# Patient Record
Sex: Female | Born: 1980 | Race: Black or African American | Hispanic: No | Marital: Married | Smoking: Never smoker
Health system: Southern US, Community
[De-identification: ages and names within clinical notes are randomized; demographics above are authoritative.]

## PROBLEM LIST (undated history)

## (undated) DIAGNOSIS — Z789 Other specified health status: Secondary | ICD-10-CM

## (undated) HISTORY — PX: NO PAST SURGERIES: SHX2092

## (undated) HISTORY — DX: Other specified health status: Z78.9

---

## 2018-04-23 ENCOUNTER — Emergency Department (HOSPITAL_BASED_OUTPATIENT_CLINIC_OR_DEPARTMENT_OTHER)
Admission: EM | Admit: 2018-04-23 | Discharge: 2018-04-23 | Disposition: A | Discharge status: Home / Self Care | Payer: BLUE CROSS/BLUE SHIELD | Attending: Emergency Medicine | Admitting: Emergency Medicine

## 2018-04-23 ENCOUNTER — Encounter (HOSPITAL_BASED_OUTPATIENT_CLINIC_OR_DEPARTMENT_OTHER): Payer: Self-pay

## 2018-04-23 ENCOUNTER — Other Ambulatory Visit: Payer: Self-pay

## 2018-04-23 DIAGNOSIS — O2341 Unspecified infection of urinary tract in pregnancy, first trimester: Secondary | ICD-10-CM | POA: Diagnosis not present

## 2018-04-23 DIAGNOSIS — Z3A00 Weeks of gestation of pregnancy not specified: Secondary | ICD-10-CM | POA: Insufficient documentation

## 2018-04-23 DIAGNOSIS — O23591 Infection of other part of genital tract in pregnancy, first trimester: Secondary | ICD-10-CM | POA: Diagnosis not present

## 2018-04-23 DIAGNOSIS — R112 Nausea with vomiting, unspecified: Secondary | ICD-10-CM

## 2018-04-23 DIAGNOSIS — O9989 Other specified diseases and conditions complicating pregnancy, childbirth and the puerperium: Secondary | ICD-10-CM

## 2018-04-23 DIAGNOSIS — R69 Illness, unspecified: Secondary | ICD-10-CM

## 2018-04-23 DIAGNOSIS — O219 Vomiting of pregnancy, unspecified: Secondary | ICD-10-CM | POA: Insufficient documentation

## 2018-04-23 DIAGNOSIS — A5901 Trichomonal vulvovaginitis: Secondary | ICD-10-CM

## 2018-04-23 DIAGNOSIS — R8271 Bacteriuria: Secondary | ICD-10-CM

## 2018-04-23 DIAGNOSIS — J111 Influenza due to unidentified influenza virus with other respiratory manifestations: Secondary | ICD-10-CM

## 2018-04-23 LAB — URINALYSIS, ROUTINE W REFLEX MICROSCOPIC
Glucose, UA: NEGATIVE mg/dL
KETONES UR: 40 mg/dL — AB
NITRITE: NEGATIVE
PH: 6 (ref 5.0–8.0)
Protein, ur: 30 mg/dL — AB
Specific Gravity, Urine: 1.025 (ref 1.005–1.030)

## 2018-04-23 LAB — URINALYSIS, MICROSCOPIC (REFLEX)

## 2018-04-23 LAB — PREGNANCY, URINE: Preg Test, Ur: POSITIVE — AB

## 2018-04-23 LAB — INFLUENZA PANEL BY PCR (TYPE A & B)
INFLBPCR: NEGATIVE
Influenza A By PCR: NEGATIVE

## 2018-04-23 MED ORDER — CEPHALEXIN 500 MG PO CAPS: 500.0000 mg | ORAL_CAPSULE | Freq: Two times a day (BID) | ORAL | 0 refills | Status: AC

## 2018-04-23 MED ORDER — OSELTAMIVIR PHOSPHATE 75 MG PO CAPS: 75.0000 mg | ORAL_CAPSULE | Freq: Two times a day (BID) | ORAL | 0 refills | Status: AC

## 2018-04-23 MED ORDER — ONDANSETRON HCL 4 MG/2ML IJ SOLN
4.0000 mg | Freq: Once | INTRAMUSCULAR | Status: AC
  Administered 2018-04-23: 4 mg via INTRAVENOUS
  Filled 2018-04-23: qty 2

## 2018-04-23 MED ORDER — ONDANSETRON 4 MG PO TBDP: ORAL_TABLET | ORAL | 0 refills | Status: AC

## 2018-04-23 MED ORDER — METRONIDAZOLE 500 MG PO TABS
2000.0000 mg | ORAL_TABLET | Freq: Once | ORAL | Status: AC
  Administered 2018-04-23: 2000 mg via ORAL
  Filled 2018-04-23: qty 4

## 2018-04-23 MED ORDER — SODIUM CHLORIDE 0.9 % IV BOLUS
1000.0000 mL | Freq: Once | INTRAVENOUS | Status: AC
  Administered 2018-04-23: 1000 mL via INTRAVENOUS

## 2018-04-23 NOTE — ED Provider Notes (Signed)
[facility] EMERGENCY DEPARTMENT Provider Note   CSN: [id number] Arrival date & time: 04/23/18  1356     History   Chief Complaint Chief Complaint  Patient presents with  . Influenza    HPI Yolanda Mora is a 38 y.o. female.  Yolanda Mora is a 38 y.o. female who is otherwise healthy, presents to the emergency department for evaluation of 2 days of chills, body aches, cough, nasal congestion, nausea and vomiting.  Patient also reports that she is pregnant, had positive home pregnancy test a few weeks ago and reports that her LMP was on 02/15/2018, she has not seen an OB/GYN yet or had any prenatal care.  Patient reports that flulike symptoms started 2 days ago and have been constant and worsening since onset.  She reports cough is dry and nonproductive.  She has not taken her temperature at home but has had intermittent chills with low-grade fever on arrival.  She reports no chest pain or shortness of breath.  She has had nausea and vomiting over the past 2 days but no other nausea and vomiting during her pregnancy so far, ports she has had a hard time keeping down liquids or solids and has not taken anything for this nausea prior to arrival.  She denies any associated abdominal pain or pelvic pain and has not had any vaginal bleeding or discharge.  She denies any dysuria or urinary frequency.  She took some Tylenol for her symptoms yesterday but has not taken anything else prior to arrival, no other aggravating or alleviating factors.  No flu vaccine this year.  Patient has OB/GYN appointment on 2/4.     History reviewed. No pertinent past medical history.  There are no active problems to display for this patient.   History reviewed. No pertinent surgical history.   OB History    Gravida  1   Para      Term      Preterm      AB      Living        SAB      TAB      Ectopic      Multiple      Live Births               Home Medications    Prior to  Admission medications   Not on File    Family History No family history on file.  Social History Social History   Tobacco Use  . Smoking status: Never Smoker  . Smokeless tobacco: Never Used  Substance Use Topics  . Alcohol use: Never    Frequency: Never  . Drug use: Never     Allergies   Patient has no known allergies.   Review of Systems Review of Systems  Constitutional: Positive for chills and fever.  HENT: Positive for congestion, postnasal drip, rhinorrhea and sore throat. Negative for ear pain.   Respiratory: Positive for cough. Negative for chest tightness, shortness of breath and wheezing.   Cardiovascular: Negative for chest pain and leg swelling.  Gastrointestinal: Positive for nausea and vomiting. Negative for abdominal pain, blood in stool, constipation and diarrhea.  Genitourinary: Negative for dysuria, flank pain, frequency, hematuria, menstrual problem, pelvic pain, vaginal bleeding and vaginal discharge.  Musculoskeletal: Negative for arthralgias and myalgias.  Skin: Negative for color change and rash.  Neurological: Negative for dizziness, syncope and light-headedness.  All other systems reviewed and are negative.    Physical Exam Updated Vital  Signs BP (!) 149/80 (BP Location: Left Arm)   Pulse 84   Temp 99.4 F (37.4 C) (Oral)   Resp 18   Ht 5\' 9"  (1.753 m)   Wt 93 kg   LMP 02/15/2018 (Exact Date)   SpO2 99%   BMI 30.28 kg/m   Physical Exam Vitals signs and nursing note reviewed.  Constitutional:      General: She is not in acute distress.    Appearance: Normal appearance. She is well-developed and normal weight. She is not ill-appearing or diaphoretic.  HENT:     Head: Normocephalic and atraumatic.     Right Ear: Tympanic membrane and ear canal normal.     Left Ear: Tympanic membrane and ear canal normal.     Nose: Congestion and rhinorrhea present.     Comments: Bilateral nares patent with moderate mucosal edema and clear rhinorrhea  present.     Mouth/Throat:     Mouth: Mucous membranes are moist.     Pharynx: Oropharynx is clear. Posterior oropharyngeal erythema present.     Comments: Posterior oropharynx clear and mucous membranes moist, there is mild erythema but no edema or tonsillar exudates, uvula midline, normal phonation, no trismus, tolerating secretions without difficulty. Eyes:     General:        Right eye: No discharge.        Left eye: No discharge.  Neck:     Musculoskeletal: Neck supple. No neck rigidity.     Comments: No rigidity Cardiovascular:     Rate and Rhythm: Normal rate and regular rhythm.     Pulses: Normal pulses.     Heart sounds: Normal heart sounds. No murmur. No friction rub. No gallop.   Pulmonary:     Effort: Pulmonary effort is normal. No respiratory distress.     Breath sounds: Normal breath sounds.     Comments: Respirations equal and unlabored, patient able to speak in full sentences, lungs clear to auscultation bilaterally Abdominal:     General: Abdomen is flat. Bowel sounds are normal. There is no distension.     Palpations: Abdomen is soft. There is no mass.     Tenderness: There is no abdominal tenderness. There is no guarding.     Comments: Abdomen soft, nondistended, nontender to palpation in all quadrants without guarding or peritoneal signs  Genitourinary:    Comments: Deferred given patient is having no vaginal bleeding or discharge or lower abdominal pain Musculoskeletal:        General: No deformity.     Right lower leg: No edema.     Left lower leg: No edema.  Lymphadenopathy:     Cervical: No cervical adenopathy.  Skin:    General: Skin is warm and dry.     Capillary Refill: Capillary refill takes less than 2 seconds.  Neurological:     Mental Status: She is alert and oriented to person, place, and time.  Psychiatric:        Mood and Affect: Mood normal.        Behavior: Behavior normal.      ED Treatments / Results  Labs (all labs ordered are  listed, but only abnormal results are displayed) Labs Reviewed  PREGNANCY, URINE - Abnormal; Notable for the following components:      Result Value   Preg Test, Ur POSITIVE (*)    All other components within normal limits  URINALYSIS, ROUTINE W REFLEX MICROSCOPIC - Abnormal; Notable for the following components:   Hgb  urine dipstick TRACE (*)    Bilirubin Urine SMALL (*)    Ketones, ur 40 (*)    Protein, ur 30 (*)    Leukocytes, UA MODERATE (*)    All other components within normal limits  URINALYSIS, MICROSCOPIC (REFLEX) - Abnormal; Notable for the following components:   Bacteria, UA RARE (*)    Trichomonas, UA PRESENT (*)    All other components within normal limits  INFLUENZA PANEL BY PCR (TYPE A & B)    EKG None  Radiology No results found.  Procedures Procedures (including critical care time)  Medications Ordered in ED Medications  sodium chloride 0.9 % bolus 1,000 mL (0 mLs Intravenous Stopped 04/23/18 1717)  ondansetron (ZOFRAN) injection 4 mg (4 mg Intravenous Given 04/23/18 1553)  metroNIDAZOLE (FLAGYL) tablet 2,000 mg (2,000 mg Oral Given 04/23/18 1700)     Initial Impression / Assessment and Plan / ED Course  I have reviewed the triage vital signs and the nursing notes.  Pertinent labs & imaging results that were available during my care of the patient were reviewed by me and considered in my medical decision making (see chart for details).  Patient presents with 2 days of flulike illness with chills, body aches, cough, nasal congestion and sore throat.  Patient also reports she is pregnant and had positive home pregnancy test 2 weeks ago, LMP of 02/16/2012.  She is not having any abdominal pain, vaginal bleeding or vaginal discharge and has OB/GYN appointment in 3 days.  She is well-appearing with low-grade fever but normal vitals.  Lungs are clear, abdominal exam is benign.  Will check urine pregnancy to confirm pregnancy and urinalysis and will send flu swab.   Patient has had nausea and vomiting over the past 2 days but appears well-hydrated, will give IV fluids and Zofran.  Case discussed with Dr. Jodi Mourning who saw and evaluated patient as well and agrees with plan, does not feel that further work-up in the setting of pregnancy is indicated.  After Zofran and fluids patient reports she is feeling much better and has been able to tolerate p.o.  Urine pregnancy is positive and urinalysis does show moderate leukocytes with rare bacteria and WBCs present, while she is not having focal urinary symptoms this is concerning for asymptomatic bacteriuria in pregnancy, patient also found to have trichomonas on urinalysis.  Will treat with Flagyl and start patient on Keflex for bacteriuria.  Urine culture sent.  Will start patient on Tamiflu for flulike symptoms and discussed symptomatic treatment, short course of Zofran provided for persistent vomiting but I have also discussed using small frequent meals, ginger and doxylamine for nausea before this and using Zofran for breakthrough symptoms.  Patient with OB/GYN follow-up in a few days.  Return precautions discussed.  Patient expresses understanding and agreement with plan.  Patient discussed with Dr. Jodi Mourning, who saw patient as well and agrees with plan.  Final Clinical Impressions(s) / ED Diagnoses   Final diagnoses:  Influenza-like illness  Non-intractable vomiting with nausea, unspecified vomiting type  Trichomonal vaginitis during pregnancy in first trimester  Bacteriuria during pregnancy in first trimester    ED Discharge Orders         Ordered    cephALEXin (KEFLEX) 500 MG capsule  2 times daily     04/23/18 1703    oseltamivir (TAMIFLU) 75 MG capsule  Every 12 hours     04/23/18 1703    ondansetron (ZOFRAN ODT) 4 MG disintegrating tablet     04/23/18  [address]           Dartha Lodge, [place] 04/25/18 1424    Blane Ohara, MD 04/25/18 [phone]

## 2018-04-23 NOTE — ED Notes (Signed)
Pt verbalized understanding of dc instructions.

## 2018-04-23 NOTE — Discharge Instructions (Addendum)
You are having flulike symptoms, your flu test is pending but given your current pregnancy I would like for you to begin taking Tamiflu, you can also use Tylenol to help support your symptoms.  Zofran as needed for nausea and you can also eat small frequent meals and use ginger candy or over-the-counter doxylamine (Unisom).  Your urine showed trichomonas today which you were treated for, you need to let any partners know so they can be tested and treated as well and make sure you are using protection.  Your urine also showed some bacteria and since you are having some urinary discomfort please take Keflex as directed for the next 5 days, you have a culture sent will be called if a different antibiotic would be more appropriate.  Follow-up with your OB/GYN on Tuesday as directed, please begin taking prenatal vitamins.  Return to the emergency department or go to the MAU at [facility] if you begin having severe abdominal pain, vaginal bleeding, persistent vomiting and are unable to keep down fluids or any other new or concerning symptoms.

## 2018-04-23 NOTE — ED Triage Notes (Signed)
Pt c/o N/V, decreased appetite, URI symptoms, body aches x2 days.

## 2018-04-23 NOTE — ED Triage Notes (Signed)
Pt states she is pregnant and has yet to receive an EDD.

## 2018-04-25 LAB — URINE CULTURE

## 2018-04-26 ENCOUNTER — Telehealth: Payer: Self-pay | Admitting: *Deleted

## 2018-04-26 ENCOUNTER — Ambulatory Visit (INDEPENDENT_AMBULATORY_CARE_PROVIDER_SITE_OTHER): Payer: BLUE CROSS/BLUE SHIELD | Admitting: Advanced Practice Midwife

## 2018-04-26 ENCOUNTER — Encounter: Payer: Self-pay | Admitting: Advanced Practice Midwife

## 2018-04-26 DIAGNOSIS — O26891 Other specified pregnancy related conditions, first trimester: Secondary | ICD-10-CM

## 2018-04-26 DIAGNOSIS — Z6791 Unspecified blood type, Rh negative: Secondary | ICD-10-CM

## 2018-04-26 DIAGNOSIS — Z113 Encounter for screening for infections with a predominantly sexual mode of transmission: Secondary | ICD-10-CM

## 2018-04-26 DIAGNOSIS — Z3480 Encounter for supervision of other normal pregnancy, unspecified trimester: Secondary | ICD-10-CM | POA: Insufficient documentation

## 2018-04-26 DIAGNOSIS — O09521 Supervision of elderly multigravida, first trimester: Secondary | ICD-10-CM

## 2018-04-26 DIAGNOSIS — O26899 Other specified pregnancy related conditions, unspecified trimester: Secondary | ICD-10-CM | POA: Insufficient documentation

## 2018-04-26 DIAGNOSIS — Z3481 Encounter for supervision of other normal pregnancy, first trimester: Secondary | ICD-10-CM

## 2018-04-26 DIAGNOSIS — O09529 Supervision of elderly multigravida, unspecified trimester: Secondary | ICD-10-CM

## 2018-04-26 LAB — POCT URINALYSIS DIPSTICK OB
Blood, UA: NEGATIVE
Glucose, UA: NEGATIVE
Leukocytes, UA: NEGATIVE
Spec Grav, UA: 1.02 (ref 1.010–1.025)
pH, UA: 6 (ref 5.0–8.0)

## 2018-04-26 MED ORDER — PROMETHAZINE HCL 25 MG PO TABS: 25.0000 mg | ORAL_TABLET | Freq: Four times a day (QID) | ORAL | 2 refills | Status: AC | PRN

## 2018-04-26 MED ORDER — PRENATAL VITAMINS 0.8 MG PO TABS: 1.0000 | ORAL_TABLET | Freq: Every day | ORAL | 12 refills | Status: AC

## 2018-04-26 NOTE — Patient Instructions (Signed)
First Trimester of Pregnancy  The first trimester of pregnancy is from week 1 until the end of week 13 (months 1 through 3). During this time, your baby will begin to develop inside you. At 6-8 weeks, the eyes and face are formed, and the heartbeat can be seen on ultrasound. At the end of 12 weeks, all the baby's organs are formed. Prenatal care is all the medical care you receive before the birth of your baby. Make sure you get good prenatal care and follow all of your doctor's instructions. Follow these instructions at home: Medicines  Take over-the-counter and prescription medicines only as told by your doctor. Some medicines are safe and some medicines are not safe during pregnancy.  Take a prenatal vitamin that contains at least 600 micrograms (mcg) of folic acid.  If you have trouble pooping (constipation), take medicine that will make your stool soft (stool softener) if your doctor approves. Eating and drinking   Eat regular, healthy meals.  Your doctor will tell you the amount of weight gain that is right for you.  Avoid raw meat and uncooked cheese.  If you feel sick to your stomach (nauseous) or throw up (vomit): ? Eat 4 or 5 small meals a day instead of 3 large meals. ? Try eating a few soda crackers. ? Drink liquids between meals instead of during meals.  To prevent constipation: ? Eat foods that are high in fiber, like fresh fruits and vegetables, whole grains, and beans. ? Drink enough fluids to keep your pee (urine) clear or pale yellow. Activity  Exercise only as told by your doctor. Stop exercising if you have cramps or pain in your lower belly (abdomen) or low back.  Do not exercise if it is too hot, too humid, or if you are in a place of great height (high altitude).  Try to avoid standing for long periods of time. Move your legs often if you must stand in one place for a long time.  Avoid heavy lifting.  Wear low-heeled shoes. Sit and stand up straight.   You can have sex unless your doctor tells you not to. Relieving pain and discomfort  Wear a good support bra if your breasts are sore.  Take warm water baths (sitz baths) to soothe pain or discomfort caused by hemorrhoids. Use hemorrhoid cream if your doctor says it is okay.  Rest with your legs raised if you have leg cramps or low back pain.  If you have puffy, bulging veins (varicose veins) in your legs: ? Wear support hose or compression stockings as told by your doctor. ? Raise (elevate) your feet for 15 minutes, 3-4 times a day. ? Limit salt in your food. Prenatal care  Schedule your prenatal visits by the twelfth week of pregnancy.  Write down your questions. Take them to your prenatal visits.  Keep all your prenatal visits as told by your doctor. This is important. Safety  Wear your seat belt at all times when driving.  Make a list of emergency phone numbers. The list should include numbers for family, friends, the hospital, and police and fire departments. General instructions  Ask your doctor for a referral to a local prenatal class. Begin classes no later than at the start of month 6 of your pregnancy.  Ask for help if you need counseling or if you need help with nutrition. Your doctor can give you advice or tell you where to go for help.  Do not use hot tubs, steam   rooms, or saunas.  Do not douche or use tampons or scented sanitary pads.  Do not cross your legs for long periods of time.  Avoid all herbs and alcohol. Avoid drugs that are not approved by your doctor.  Do not use any tobacco products, including cigarettes, chewing tobacco, and electronic cigarettes. If you need help quitting, ask your doctor. You may get counseling or other support to help you quit.  Avoid cat litter boxes and soil used by cats. These carry germs that can cause birth defects in the baby and can cause a loss of your baby (miscarriage) or stillbirth.  Visit your dentist. At home,  brush your teeth with a soft toothbrush. Be gentle when you floss. Contact a doctor if:  You are dizzy.  You have mild cramps or pressure in your lower belly.  You have a nagging pain in your belly area.  You continue to feel sick to your stomach, you throw up, or you have watery poop (diarrhea).  You have a bad smelling fluid coming from your vagina.  You have pain when you pee (urinate).  You have increased puffiness (swelling) in your face, hands, legs, or ankles. Get help right away if:  You have a fever.  You are leaking fluid from your vagina.  You have spotting or bleeding from your vagina.  You have very bad belly cramping or pain.  You gain or lose weight rapidly.  You throw up blood. It may look like coffee grounds.  You are around people who have German measles, fifth disease, or chickenpox.  You have a very bad headache.  You have shortness of breath.  You have any kind of trauma, such as from a fall or a car accident. Summary  The first trimester of pregnancy is from week 1 until the end of week 13 (months 1 through 3).  To take care of yourself and your unborn baby, you will need to eat healthy meals, take medicines only if your doctor tells you to do so, and do activities that are safe for you and your baby.  Keep all follow-up visits as told by your doctor. This is important as your doctor will have to ensure that your baby is healthy and growing well. This information is not intended to replace advice given to you by your health care provider. Make sure you discuss any questions you have with your health care provider. Document Released: 08/26/2007 Document Revised: 03/17/2016 Document Reviewed: 03/17/2016 Elsevier Interactive Patient Education  2019 [employer] Inc[employer]  

## 2018-04-26 NOTE — Telephone Encounter (Signed)
Post ED Visit - Positive Culture Follow-up  Culture report reviewed by antimicrobial stewardship pharmacist:  []  Enzo Bi, Pharm.D. []  Celedonio Miyamoto, Pharm.D., BCPS AQ-ID []  Garvin Fila, Pharm.D., BCPS []  Georgina Pillion, Pharm.D., BCPS []  [place], [address].D., BCPS, AAHIVP []  Estella Husk, Pharm.D., BCPS, AAHIVP []  Lysle Pearl, PharmD, BCPS []  Phillips Climes, PharmD, BCPS []  Agapito Games, PharmD, BCPS []  Verlan Friends, PharmD Babs Bertin, PharmD  Positive urine culture Treated with cephalexin, organism sensitive to the same and no further patient follow-up is required at this time.  Virl [facility] 04/26/2018, 10:39 AM

## 2018-04-26 NOTE — Progress Notes (Signed)
  Subjective:    Yolanda Mora is a [id number] [redacted]w[redacted]d being seen today for her first obstetrical visit.  Her obstetrical history is significant for AMA. Patient does intend to breast feed. Pregnancy history fully reviewed.  Patient reports nausea.  Vitals:   04/26/18 0910  BP: (!) 122/58  Pulse: 72  Weight: 93.9 kg    HISTORY: OB History  Gravida Para Term Preterm AB Living  6 5 5     5   SAB TAB Ectopic Multiple Live Births          5    # Outcome Date GA Lbr Len/2nd Weight Sex Delivery Anes PTL Lv  6 Current           5 Term 2018 [redacted]w[redacted]d   M Vag-Spont None  LIV  4 Term 2014 [redacted]w[redacted]d   F Vag-Spont None  LIV  3 Term 2008 [redacted]w[redacted]d   F Vag-Spont None N LIV  2 Term 2003 [redacted]w[redacted]d   F Vag-Spont None N LIV  1 Term 2002 [redacted]w[redacted]d   M Vag-Spont None N LIV   No past medical history on file. No past surgical history on file. No family history on file.   Exam    Uterus:     Pelvic Exam:    Perineum: Normal Perineum   Vulva: Bartholin's, Urethra, Skene's normal   Vagina:  normal discharge   pH:    Cervix: no cervical motion tenderness   Adnexa: no mass, fullness, tenderness   Bony Pelvis: gynecoid  System: Breast:  normal appearance, no masses or tenderness   Skin: normal coloration and turgor, no rashes    Neurologic: oriented, grossly non-focal   Extremities: normal strength, tone, and muscle mass   HEENT neck supple with midline trachea   Mouth/Teeth mucous membranes moist, pharynx normal without lesions   Neck no masses   Cardiovascular: regular rate and rhythm   Respiratory:  appears well, vitals normal, no respiratory distress, acyanotic, normal RR, ear and throat exam is normal, neck free of mass or lymphadenopathy, chest clear, no wheezing, crepitations, rhonchi, normal symmetric air entry   Abdomen: soft, non-tender; bowel sounds normal; no masses,  no organomegaly   Urinary: urethral meatus normal      Assessment:    Pregnancy: [id number] Patient Active Problem List   Diagnosis  Date Noted  . Supervision of other normal pregnancy, antepartum 04/26/2018  . AMA (advanced maternal age) multigravida 35+ 04/26/2018  . Rh negative status during pregnancy 04/26/2018        Plan:     Initial labs drawn. Prenatal vitamins. Problem list reviewed and updated. Genetic Screening discussed First Screen: Wants to do Panorama, then will do AFP only.  Ultrasound discussed; fetal survey: requested.  Follow up in 4 weeks. 50% of 30 min visit spent on counseling and coordination of care.   Welcomed to practice Last baby born at [facility], others in [place] Routines reviewed, including learners and move to new [facility]   Wynelle Bourgeois 04/26/2018

## 2018-04-27 LAB — GC/CHLAMYDIA PROBE AMP (~~LOC~~) NOT AT ARMC
Chlamydia: NEGATIVE
Neisseria Gonorrhea: NEGATIVE

## 2018-04-28 LAB — URINE CULTURE, OB REFLEX: Organism ID, Bacteria: NO GROWTH

## 2018-04-28 LAB — CULTURE, OB URINE

## 2018-05-09 LAB — OBSTETRIC PANEL, INCLUDING HIV
ANTIBODY SCREEN: NEGATIVE
Basophils Absolute: 0 10*3/uL (ref 0.0–0.2)
Basos: 1 %
EOS (ABSOLUTE): 0 10*3/uL (ref 0.0–0.4)
EOS: 1 %
HEMATOCRIT: 34.8 % (ref 34.0–46.6)
HEMOGLOBIN: 11.8 g/dL (ref 11.1–15.9)
HIV Screen 4th Generation wRfx: NONREACTIVE
Hepatitis B Surface Ag: NEGATIVE
Immature Grans (Abs): 0 10*3/uL (ref 0.0–0.1)
Immature Granulocytes: 0 %
Lymphocytes Absolute: 1.2 10*3/uL (ref 0.7–3.1)
Lymphs: 28 %
MCH: 29.1 pg (ref 26.6–33.0)
MCHC: 33.9 g/dL (ref 31.5–35.7)
MCV: 86 fL (ref 79–97)
Monocytes Absolute: 0.3 10*3/uL (ref 0.1–0.9)
Monocytes: 7 %
Neutrophils Absolute: 2.7 10*3/uL (ref 1.4–7.0)
Neutrophils: 63 %
Platelets: 307 10*3/uL (ref 150–450)
RBC: 4.05 x10E6/uL (ref 3.77–5.28)
RDW: 14.3 % (ref 11.7–15.4)
RH TYPE: POSITIVE
RPR: NONREACTIVE
Rubella Antibodies, IGG: 1.55 index (ref >0.99)
WBC: 4.2 10*3/uL (ref 3.4–10.8)

## 2018-05-09 LAB — HEMOGLOBINOPATHY EVALUATION
Ferritin: 42 ng/mL (ref 15–150)
Hgb A2 Quant: 2 % (ref 1.8–3.2)
Hgb A: 98 % (ref 96.4–98.8)
Hgb C: 0 %
Hgb F Quant: 0 % (ref 0.0–2.0)
Hgb S: 0 %
Hgb Solubility: NEGATIVE
Hgb Variant: 0 %

## 2018-05-09 LAB — SMN1 COPY NUMBER ANALYSIS (SMA CARRIER SCREENING)

## 2018-05-24 ENCOUNTER — Ambulatory Visit (INDEPENDENT_AMBULATORY_CARE_PROVIDER_SITE_OTHER): Payer: BLUE CROSS/BLUE SHIELD | Admitting: Advanced Practice Midwife

## 2018-05-24 ENCOUNTER — Encounter: Payer: Self-pay | Admitting: Advanced Practice Midwife

## 2018-05-24 DIAGNOSIS — Z3480 Encounter for supervision of other normal pregnancy, unspecified trimester: Secondary | ICD-10-CM

## 2018-05-24 DIAGNOSIS — Z3482 Encounter for supervision of other normal pregnancy, second trimester: Secondary | ICD-10-CM

## 2018-05-24 DIAGNOSIS — Z3A14 14 weeks gestation of pregnancy: Secondary | ICD-10-CM

## 2018-05-24 NOTE — Progress Notes (Signed)
   PRENATAL VISIT NOTE  Subjective:  Yolanda Mora is a 38 y.o. G6P5005 at [redacted]w[redacted]d being seen today for ongoing prenatal care.  She is currently monitored for the following issues for this low-risk pregnancy and has Supervision of other normal pregnancy, antepartum; AMA (advanced maternal age) multigravida 35+; and Rh negative status during pregnancy on their problem list.  Patient reports no complaints.  Contractions: Not present. Vag. Bleeding: None.   . Denies leaking of fluid.   The following portions of the patient's history were reviewed and updated as appropriate: allergies, current medications, past family history, past medical history, past social history, past surgical history and problem list. Problem list updated.  Objective:   Vitals:   05/24/18 0919  BP: 130/63  Pulse: 72  Weight: 95.3 kg    Fetal Status: Fetal Heart Rate (bpm): 158 Fundal Height: 14 cm       General:  Alert, oriented and cooperative. Patient is in no acute distress.  Skin: Skin is warm and dry. No rash noted.   Cardiovascular: Normal heart rate noted  Respiratory: Normal respiratory effort, no problems with respiration noted  Abdomen: Soft, gravid, appropriate for gestational age.  Pain/Pressure: Absent     Pelvic: Cervical exam deferred        Extremities: Normal range of motion.  Edema: None  Mental Status: Normal mood and affect. Normal behavior. Normal judgment and thought content.   Assessment and Plan:  Pregnancy: G6P5005 at [redacted]w[redacted]d  1. Supervision of other normal pregnancy, antepartum     Reviewed normal prenatal labs      Has [place] scheduled for April      Discussed possible recommendation for ASA, but she does not have any other risk factors.   Preterm labor symptoms and general obstetric precautions including but not limited to vaginal bleeding, contractions, leaking of fluid and fetal movement were reviewed in detail with the patient. Please refer to After Visit Summary for other counseling  recommendations.    Future Appointments  Date Time Provider Department Center  06/24/2018 10:30 AM Levie Heritage, DO [facility] None  06/30/2018 10:00 AM [facility] [place] 3 [facility]    Wynelle Bourgeois, CNM

## 2018-05-24 NOTE — Patient Instructions (Signed)

## 2018-06-24 ENCOUNTER — Other Ambulatory Visit: Payer: Self-pay

## 2018-06-24 ENCOUNTER — Ambulatory Visit (INDEPENDENT_AMBULATORY_CARE_PROVIDER_SITE_OTHER): Payer: BLUE CROSS/BLUE SHIELD | Admitting: Family Medicine

## 2018-06-24 VITALS — BP 132/76 | HR 88 | Wt 212.0 lb

## 2018-06-24 DIAGNOSIS — Z6791 Unspecified blood type, Rh negative: Secondary | ICD-10-CM

## 2018-06-24 DIAGNOSIS — O26892 Other specified pregnancy related conditions, second trimester: Secondary | ICD-10-CM

## 2018-06-24 DIAGNOSIS — Z3480 Encounter for supervision of other normal pregnancy, unspecified trimester: Secondary | ICD-10-CM

## 2018-06-24 DIAGNOSIS — O09522 Supervision of elderly multigravida, second trimester: Secondary | ICD-10-CM

## 2018-06-24 DIAGNOSIS — O360920 Maternal care for other rhesus isoimmunization, second trimester, not applicable or unspecified: Secondary | ICD-10-CM

## 2018-06-24 DIAGNOSIS — Z3A18 18 weeks gestation of pregnancy: Secondary | ICD-10-CM

## 2018-06-24 MED ORDER — ASPIRIN EC 81 MG PO TBEC: 81.0000 mg | DELAYED_RELEASE_TABLET | Freq: Every day | ORAL | 2 refills | Status: AC

## 2018-06-24 NOTE — Progress Notes (Signed)
   PRENATAL VISIT NOTE  Subjective:  Yolanda Mora is a 38 y.o. G6P5005 at [redacted]w[redacted]d being seen today for ongoing prenatal care.  She is currently monitored for the following issues for this high-risk pregnancy and has Supervision of other normal pregnancy, antepartum; AMA (advanced maternal age) multigravida 35+; and Rh negative status during pregnancy on their problem list.  Patient reports no complaints.  Contractions: Not present. Vag. Bleeding: None.   . Denies leaking of fluid.   The following portions of the patient's history were reviewed and updated as appropriate: allergies, current medications, past family history, past medical history, past social history, past surgical history and problem list.   Objective:   Vitals:   06/24/18 1028  BP: 132/76  Pulse: 88  Weight: 212 lb (96.2 kg)    Fetal Status: Fetal Heart Rate (bpm): 145 Fundal Height: 18 cm       General:  Alert, oriented and cooperative. Patient is in no acute distress.  Skin: Skin is warm and dry. No rash noted.   Cardiovascular: Normal heart rate noted  Respiratory: Normal respiratory effort, no problems with respiration noted  Abdomen: Soft, gravid, appropriate for gestational age.  Pain/Pressure: Absent     Pelvic: Cervical exam deferred        Extremities: Normal range of motion.  Edema: None  Mental Status: Normal mood and affect. Normal behavior. Normal judgment and thought content.   Assessment and Plan:  Pregnancy: G6P5005 at [redacted]w[redacted]d 1. Supervision of other normal pregnancy, antepartum FHT and FH normal. Weight gain normal.  - Babyscripts Schedule Optimization  2. Multigravida of advanced maternal age in second trimester Start ASA 81mg   3. Rh negative status during pregnancy in second trimester Rhogam at next visit  Preterm labor symptoms and general obstetric precautions including but not limited to vaginal bleeding, contractions, leaking of fluid and fetal movement were reviewed in detail with the  patient. Please refer to After Visit Summary for other counseling recommendations.   Return in about 8 weeks (around 08/19/2018) for OB f/u.  Future Appointments  Date Time Provider Department Center  07/11/2018 12:45 PM [facility] NURSE [facility]  07/11/2018 12:45 PM [facility] [place] [facility]  08/19/2018  8:45 AM Levie Heritage, DO [facility] None    Levie Heritage, DO

## 2018-06-30 ENCOUNTER — Other Ambulatory Visit (HOSPITAL_COMMUNITY): Payer: BLUE CROSS/BLUE SHIELD

## 2018-06-30 ENCOUNTER — Ambulatory Visit (HOSPITAL_COMMUNITY): Payer: BLUE CROSS/BLUE SHIELD

## 2018-07-11 ENCOUNTER — Other Ambulatory Visit: Payer: Self-pay

## 2018-07-11 ENCOUNTER — Ambulatory Visit (HOSPITAL_COMMUNITY): Payer: BLUE CROSS/BLUE SHIELD

## 2018-07-11 ENCOUNTER — Ambulatory Visit (HOSPITAL_COMMUNITY): Payer: BLUE CROSS/BLUE SHIELD | Admitting: *Deleted

## 2018-07-11 ENCOUNTER — Other Ambulatory Visit (HOSPITAL_COMMUNITY): Payer: Self-pay | Admitting: *Deleted

## 2018-07-11 ENCOUNTER — Ambulatory Visit (HOSPITAL_COMMUNITY)
Admission: RE | Admit: 2018-07-11 | Discharge: 2018-07-11 | Disposition: A | Discharge status: Home / Self Care | Payer: BLUE CROSS/BLUE SHIELD | Source: Ambulatory Visit | Attending: Obstetrics and Gynecology | Admitting: Obstetrics and Gynecology

## 2018-07-11 ENCOUNTER — Encounter (HOSPITAL_COMMUNITY): Payer: Self-pay

## 2018-07-11 VITALS — Temp 98.7°F

## 2018-07-11 DIAGNOSIS — O360122 Maternal care for anti-D [Rh] antibodies, second trimester, fetus 2: Secondary | ICD-10-CM | POA: Diagnosis not present

## 2018-07-11 DIAGNOSIS — O09523 Supervision of elderly multigravida, third trimester: Secondary | ICD-10-CM

## 2018-07-11 DIAGNOSIS — Z363 Encounter for antenatal screening for malformations: Secondary | ICD-10-CM

## 2018-07-11 DIAGNOSIS — Z3480 Encounter for supervision of other normal pregnancy, unspecified trimester: Secondary | ICD-10-CM | POA: Diagnosis present

## 2018-07-11 DIAGNOSIS — O283 Abnormal ultrasonic finding on antenatal screening of mother: Secondary | ICD-10-CM

## 2018-07-11 DIAGNOSIS — Z3A20 20 weeks gestation of pregnancy: Secondary | ICD-10-CM | POA: Diagnosis not present

## 2018-07-11 DIAGNOSIS — O09529 Supervision of elderly multigravida, unspecified trimester: Secondary | ICD-10-CM | POA: Diagnosis present

## 2018-07-11 DIAGNOSIS — O09522 Supervision of elderly multigravida, second trimester: Secondary | ICD-10-CM | POA: Diagnosis not present

## 2018-07-12 ENCOUNTER — Encounter: Payer: Self-pay | Admitting: Advanced Practice Midwife

## 2018-07-12 DIAGNOSIS — O283 Abnormal ultrasonic finding on antenatal screening of mother: Secondary | ICD-10-CM | POA: Insufficient documentation

## 2018-07-15 LAB — MATERNIT 21 PLUS CORE, BLOOD
Fetal Fraction: 5
Result (T21): NEGATIVE
Trisomy 13 (Patau syndrome): NEGATIVE
Trisomy 18 (Edwards syndrome): NEGATIVE
Trisomy 21 (Down syndrome): NEGATIVE

## 2018-07-19 ENCOUNTER — Telehealth (HOSPITAL_COMMUNITY): Payer: Self-pay | Admitting: *Deleted

## 2018-08-18 ENCOUNTER — Ambulatory Visit (INDEPENDENT_AMBULATORY_CARE_PROVIDER_SITE_OTHER): Payer: BLUE CROSS/BLUE SHIELD | Admitting: Family Medicine

## 2018-08-18 ENCOUNTER — Other Ambulatory Visit: Payer: Self-pay

## 2018-08-18 ENCOUNTER — Encounter: Payer: Self-pay | Admitting: Family Medicine

## 2018-08-18 VITALS — BP 114/68 | HR 90 | Wt 216.0 lb

## 2018-08-18 DIAGNOSIS — Z3A26 26 weeks gestation of pregnancy: Secondary | ICD-10-CM | POA: Diagnosis not present

## 2018-08-18 DIAGNOSIS — Z3480 Encounter for supervision of other normal pregnancy, unspecified trimester: Secondary | ICD-10-CM

## 2018-08-18 DIAGNOSIS — O09522 Supervision of elderly multigravida, second trimester: Secondary | ICD-10-CM | POA: Diagnosis not present

## 2018-08-18 NOTE — Progress Notes (Signed)
   PRENATAL VISIT NOTE  Subjective:  Yolanda Mora is a 38 y.o. G6P5005 at [redacted]w[redacted]d being seen today for ongoing prenatal care.  She is currently monitored for the following issues for this high-risk pregnancy and has Supervision of other normal pregnancy, antepartum; AMA (advanced maternal age) multigravida 35+; and Abnormal fetal ultrasound on their problem list.  Patient reports no complaints.  Contractions: Not present. Vag. Bleeding: None.  Movement: Present. Denies leaking of fluid.   The following portions of the patient's history were reviewed and updated as appropriate: allergies, current medications, past family history, past medical history, past social history, past surgical history and problem list.   Objective:   Vitals:   08/18/18 0841  BP: 114/68  Pulse: 90  Weight: 216 lb 0.6 oz (98 kg)    Fetal Status: Fetal Heart Rate (bpm): 140   Movement: Present     General:  Alert, oriented and cooperative. Patient is in no acute distress.  Skin: Skin is warm and dry. No rash noted.   Cardiovascular: Normal heart rate noted  Respiratory: Normal respiratory effort, no problems with respiration noted  Abdomen: Soft, gravid, appropriate for gestational age.  Pain/Pressure: Absent     Pelvic: Cervical exam deferred        Extremities: Normal range of motion.  Edema: None  Mental Status: Normal mood and affect. Normal behavior. Normal judgment and thought content.   Assessment and Plan:  Pregnancy: G6P5005 at [redacted]w[redacted]d 1. Supervision of other normal pregnancy, antepartum FHT and FH normal - CBC - Glucose Tolerance, 2 Hours w/1 Hour - HIV Antibody (routine testing w rflx) - RPR  2. Multigravida of advanced maternal age in second trimester - CBC - Glucose Tolerance, 2 Hours w/1 Hour - HIV Antibody (routine testing w rflx) - RPR  Preterm labor symptoms and general obstetric precautions including but not limited to vaginal bleeding, contractions, leaking of fluid and fetal movement  were reviewed in detail with the patient. Please refer to After Visit Summary for other counseling recommendations.   No follow-ups on file.  Future Appointments  Date Time Provider Department Center  09/05/2018  1:00 PM [facility] [place] [facility]    Levie Heritage, DO

## 2018-08-19 ENCOUNTER — Encounter: Payer: BLUE CROSS/BLUE SHIELD | Admitting: Family Medicine

## 2018-08-20 LAB — CBC
Hematocrit: 32.3 % — ABNORMAL LOW (ref 34.0–46.6)
Hemoglobin: 11 g/dL — ABNORMAL LOW (ref 11.1–15.9)
MCH: 30.4 pg (ref 26.6–33.0)
MCHC: 34.1 g/dL (ref 31.5–35.7)
MCV: 89 fL (ref 79–97)
Platelets: 235 10*3/uL (ref 150–450)
RBC: 3.62 x10E6/uL — ABNORMAL LOW (ref 3.77–5.28)
RDW: 13.2 % (ref 11.7–15.4)
WBC: 7 10*3/uL (ref 3.4–10.8)

## 2018-08-20 LAB — GLUCOSE TOLERANCE, 2 HOURS W/ 1HR
Glucose, 1 hour: 143 mg/dL (ref 65–179)
Glucose, 2 hour: 132 mg/dL (ref 65–152)
Glucose, Fasting: 79 mg/dL (ref 65–91)

## 2018-08-20 LAB — HIV ANTIBODY (ROUTINE TESTING W REFLEX): HIV Screen 4th Generation wRfx: NONREACTIVE

## 2018-08-20 LAB — SYPHILIS: RPR W/REFLEX TO RPR TITER AND TREPONEMAL ANTIBODIES, TRADITIONAL SCREENING AND DIAGNOSIS ALGORITHM: RPR Ser Ql: NONREACTIVE

## 2018-09-05 ENCOUNTER — Other Ambulatory Visit: Payer: Self-pay

## 2018-09-05 ENCOUNTER — Encounter (HOSPITAL_COMMUNITY): Payer: Self-pay

## 2018-09-05 ENCOUNTER — Ambulatory Visit (HOSPITAL_COMMUNITY)
Admission: RE | Admit: 2018-09-05 | Discharge: 2018-09-05 | Disposition: A | Discharge status: Home / Self Care | Payer: BC Managed Care – PPO | Source: Ambulatory Visit | Attending: Obstetrics and Gynecology | Admitting: Obstetrics and Gynecology

## 2018-09-05 ENCOUNTER — Ambulatory Visit (HOSPITAL_COMMUNITY): Payer: BC Managed Care – PPO | Admitting: *Deleted

## 2018-09-05 ENCOUNTER — Other Ambulatory Visit (HOSPITAL_COMMUNITY): Payer: Self-pay | Admitting: *Deleted

## 2018-09-05 VITALS — BP 123/72 | HR 79 | Temp 98.6°F

## 2018-09-05 DIAGNOSIS — O09523 Supervision of elderly multigravida, third trimester: Secondary | ICD-10-CM

## 2018-09-05 DIAGNOSIS — O09529 Supervision of elderly multigravida, unspecified trimester: Secondary | ICD-10-CM | POA: Insufficient documentation

## 2018-09-05 DIAGNOSIS — Z3689 Encounter for other specified antenatal screening: Secondary | ICD-10-CM

## 2018-09-05 DIAGNOSIS — Z362 Encounter for other antenatal screening follow-up: Secondary | ICD-10-CM

## 2018-09-05 DIAGNOSIS — O350XX0 Maternal care for (suspected) central nervous system malformation in fetus, not applicable or unspecified: Secondary | ICD-10-CM

## 2018-09-05 DIAGNOSIS — Z3A28 28 weeks gestation of pregnancy: Secondary | ICD-10-CM

## 2018-09-05 DIAGNOSIS — O360130 Maternal care for anti-D [Rh] antibodies, third trimester, not applicable or unspecified: Secondary | ICD-10-CM

## 2018-09-15 ENCOUNTER — Ambulatory Visit: Payer: BC Managed Care – PPO | Admitting: Family Medicine

## 2018-09-15 VITALS — BP 123/73 | HR 88

## 2018-09-15 DIAGNOSIS — Z3480 Encounter for supervision of other normal pregnancy, unspecified trimester: Secondary | ICD-10-CM

## 2018-09-15 NOTE — Progress Notes (Signed)
Patient agrees to Webex visit. Jennifer Howard RN  

## 2018-09-15 NOTE — Progress Notes (Signed)
Patient never connected for WebEx appointment and did not answer despite repeat phone calls.

## 2018-10-03 ENCOUNTER — Other Ambulatory Visit: Payer: Self-pay

## 2018-10-03 ENCOUNTER — Ambulatory Visit (HOSPITAL_COMMUNITY)
Admission: RE | Admit: 2018-10-03 | Discharge: 2018-10-03 | Disposition: A | Discharge status: Home / Self Care | Payer: BC Managed Care – PPO | Source: Ambulatory Visit | Attending: Obstetrics and Gynecology | Admitting: Obstetrics and Gynecology

## 2018-10-03 DIAGNOSIS — Z362 Encounter for other antenatal screening follow-up: Secondary | ICD-10-CM

## 2018-10-03 DIAGNOSIS — Z3689 Encounter for other specified antenatal screening: Secondary | ICD-10-CM | POA: Diagnosis not present

## 2018-10-03 DIAGNOSIS — O350XX0 Maternal care for (suspected) central nervous system malformation in fetus, not applicable or unspecified: Secondary | ICD-10-CM | POA: Diagnosis not present

## 2018-10-03 DIAGNOSIS — O360130 Maternal care for anti-D [Rh] antibodies, third trimester, not applicable or unspecified: Secondary | ICD-10-CM

## 2018-10-03 DIAGNOSIS — O09523 Supervision of elderly multigravida, third trimester: Secondary | ICD-10-CM

## 2018-10-03 DIAGNOSIS — Z3A32 32 weeks gestation of pregnancy: Secondary | ICD-10-CM

## 2018-10-13 NOTE — Telephone Encounter (Signed)
Neg Mat 21 results reported to pt. 

## 2019-02-14 ENCOUNTER — Encounter (HOSPITAL_COMMUNITY): Payer: Self-pay

## 2020-08-05 IMAGING — US US MFM OB DETAIL +14 WK
1 series · 12 of 28 positions shown · non-contrast
Comparison: none

[Series 1: us mfm ob detail +14 wk · 116 acquisitions, 12 frames shown]
[im 5/116]
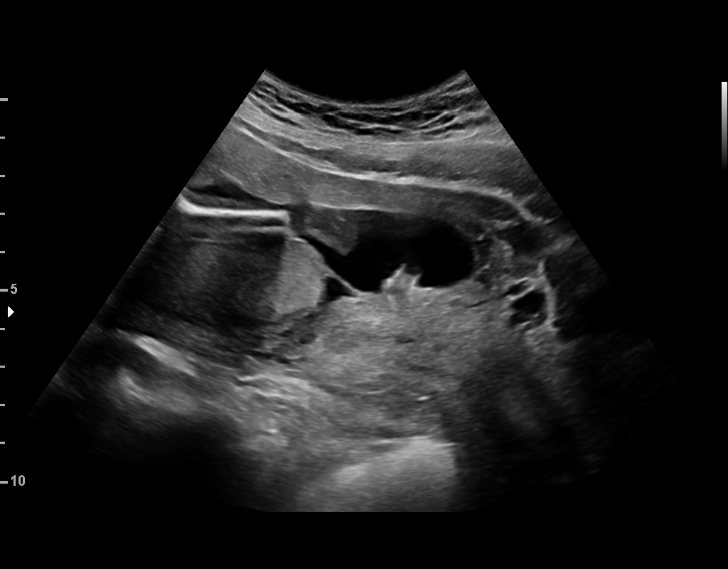
[im 13/116]
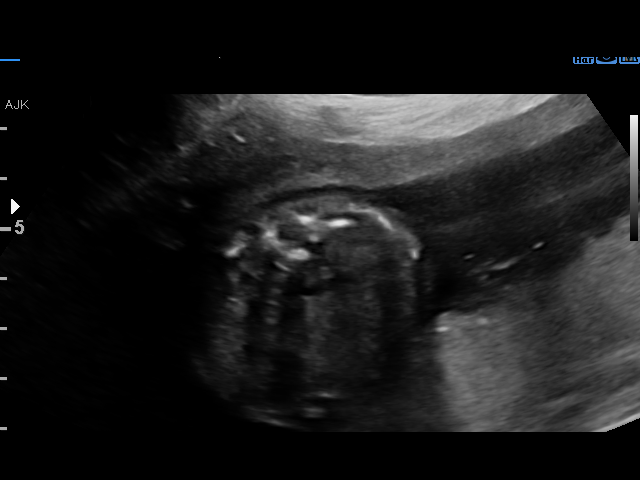
[im 22/116]
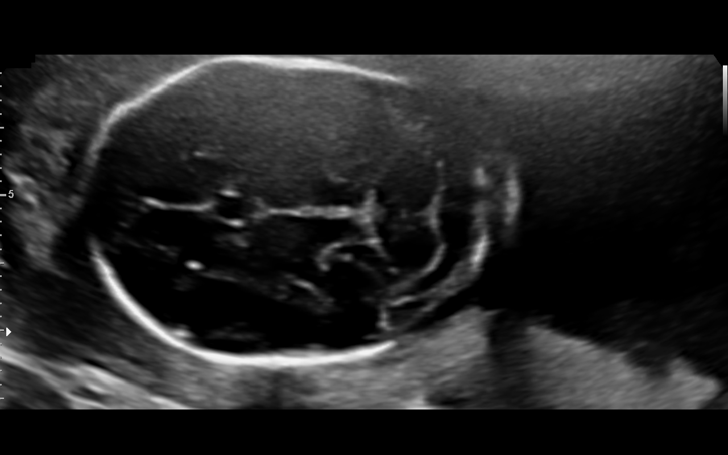
[im 35/116]
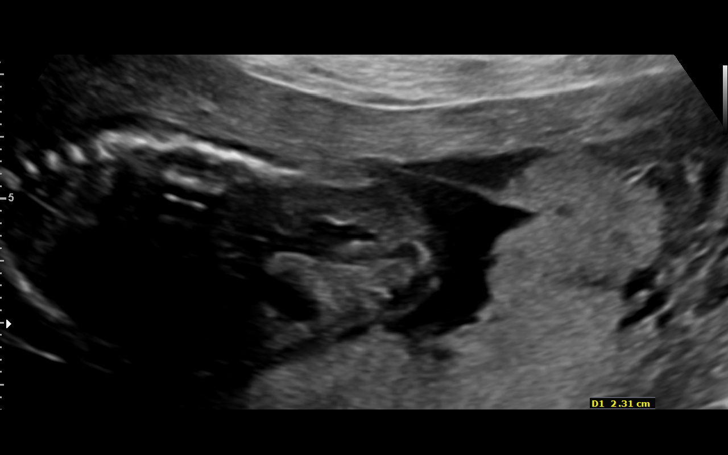
[im 43/116]
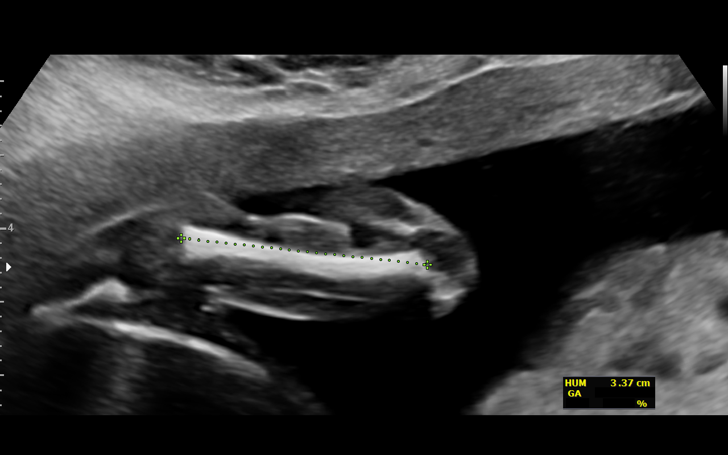
[im 52/116]
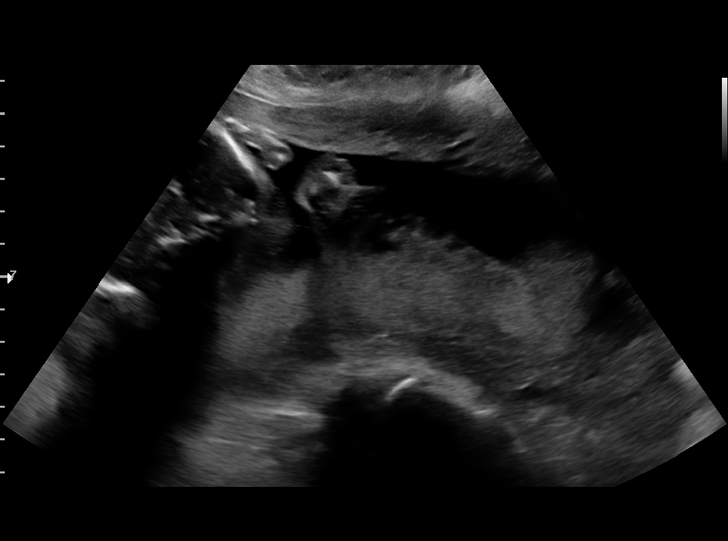
[im 64/116]
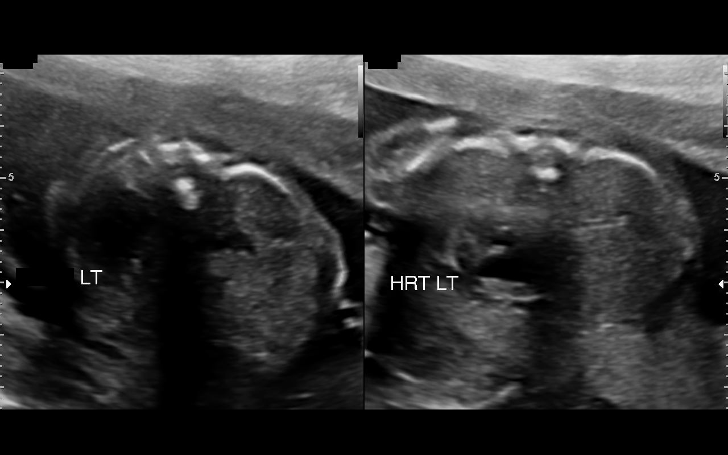
[im 73/116]
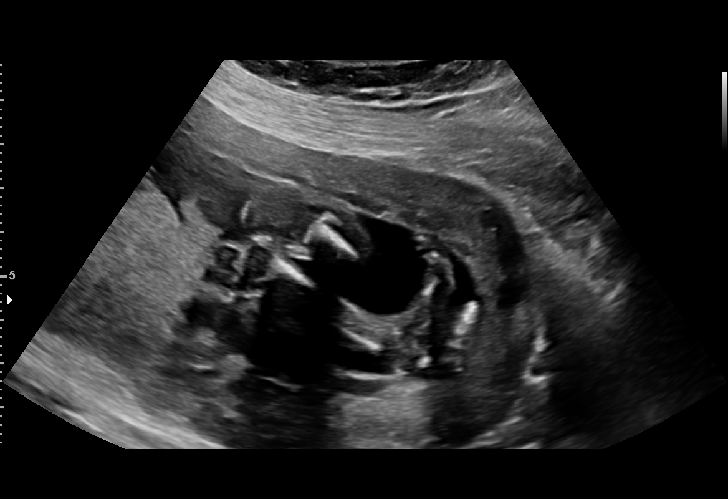
[im 81/116]
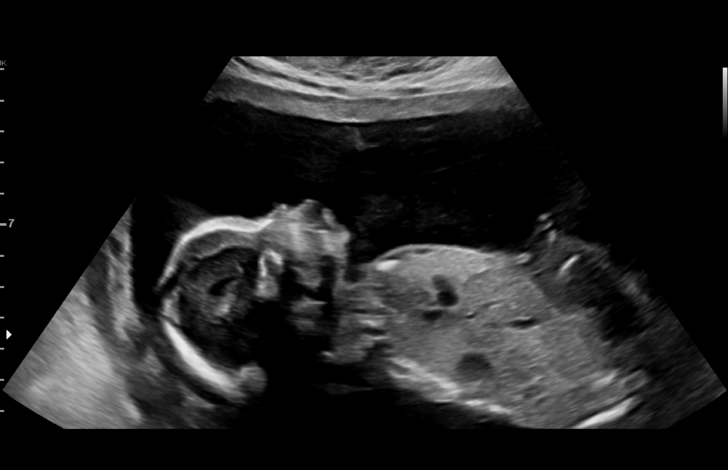
[im 94/116]
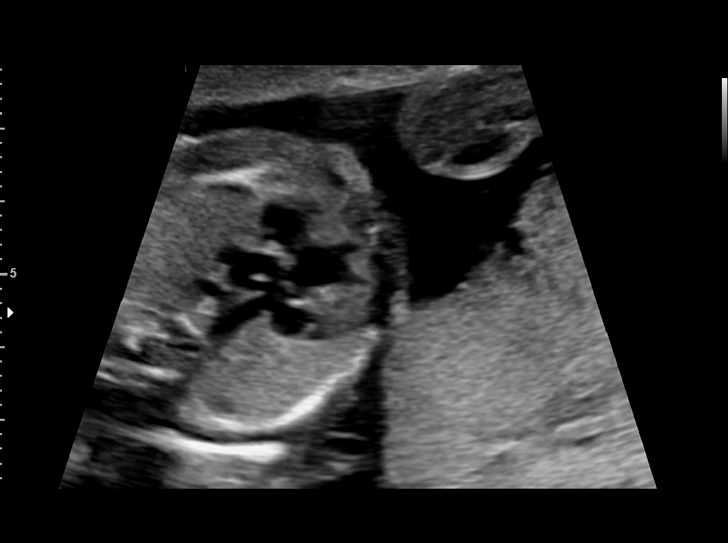
[im 103/116]
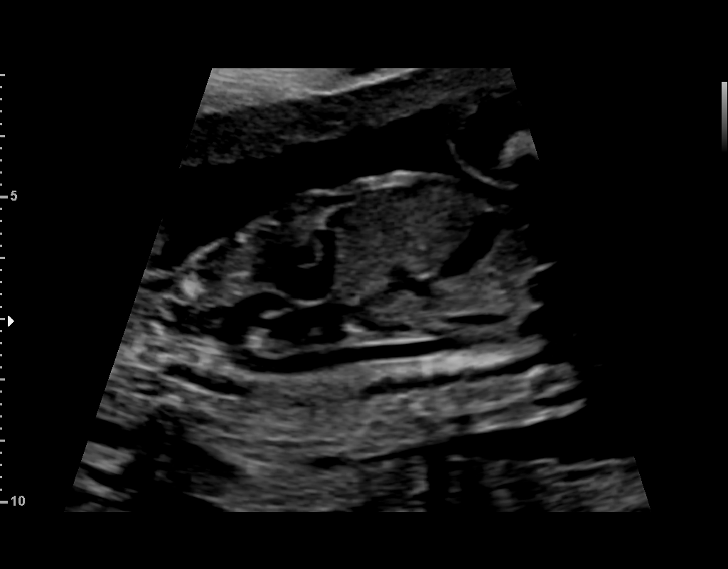
[im 111/116]
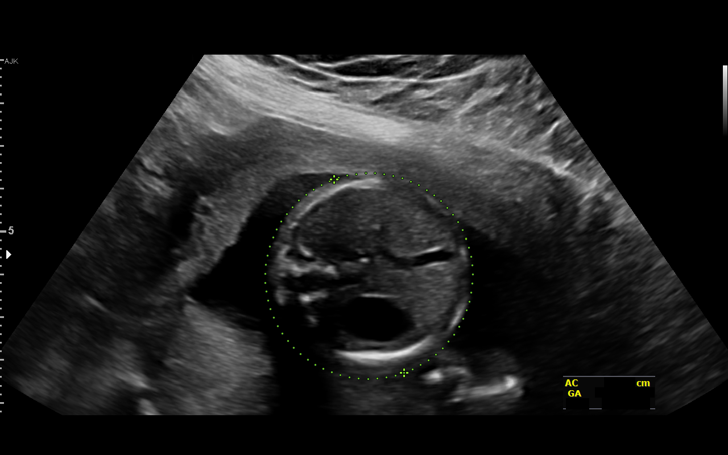

[12 of 28 positions shown; findings below may reference images not displayed]

[REDACTED]
                   TERRA CNM

 ----------------------------------------------------------------------

 ----------------------------------------------------------------------
Indications

  Encounter for antenatal screening for
  malformations
  Advanced maternal age multigravida (38),
  second trimester
  Rh negative state in antepartum
  20 weeks gestation of pregnancy
 ----------------------------------------------------------------------
Vital Signs

 BMI:
Fetal Evaluation

 Num Of Fetuses:         1
 Fetal Heart Rate(bpm):  143
 Cardiac Activity:       Observed
 Presentation:           Variable
 Placenta:               Posterior
 P. Cord Insertion:      Visualized

 Amniotic Fluid
 AFI FV:      Within normal limits

                             Largest Pocket(cm)

Biometry

 BPD:      44.1  mm     G. Age:  19w 2d          5  %    CI:        65.46   %    70 - 86
                                                         FL/HC:      19.3   %    15.9 -
 HC:       175   mm     G. Age:  20w 0d         11  %    HC/AC:      1.15        1.06 -
 AC:      152.6  mm     G. Age:  20w 3d         31  %    FL/BPD:     76.6   %
 FL:       33.8  mm     G. Age:  20w 4d         33  %    FL/AC:      22.1   %    20 - 24
 HUM:      33.2  mm     G. Age:  21w 1d         57  %
 CER:        21  mm     G. Age:  20w 0d         31  %
 LV:        7.8  mm
 CM:        5.4  mm

 Est. FW:     354  gm    0 lb 12 oz      36  %
OB History

 Gravidity:    6         Term:   5
Gestational Age

 LMP:           20w 6d        Date:  02/15/18                 EDD:   11/22/18
 U/S Today:     20w 1d                                        EDD:   11/27/18
 Best:          20w 6d     Det. By:  LMP  (02/15/18)          EDD:   11/22/18
Anatomy

 Cranium:               Appears normal         Aortic Arch:            Appears normal
 Cavum:                 Appears normal         Ductal Arch:            Appears normal
 Ventricles:            Appears normal         Diaphragm:              Appears normal
 Choroid Plexus:        Appears normal         Stomach:                Appears normal, left
                                                                       sided
 Cerebellum:            Appears normal         Abdomen:                Appears normal
 Posterior Fossa:       Appears normal         Abdominal Wall:         Appears nml (cord
                                                                       insert, abd wall)
 Nuchal Fold:           Not applicable (>20    Cord Vessels:           Appears normal (3
                        wks GA)                                        vessel cord)
 Face:                  Appears normal         Kidneys:                Bilat pyelectasis,
                        (orbits and profile)
                                                                       FtFmm, Lt4 mm
 Lips:                  Appears normal         Bladder:                Appears normal
 Thoracic:              Appears normal         Spine:                  Appears normal
 Heart:                 Appears normal         Upper Extremities:      Appears normal
                        (4CH, axis, and
                        situs)
 RVOT:                  Appears normal         Lower Extremities:      Appears normal
 LVOT:                  Appears normal

 Other:  Fetus appears to be female. Heels and 5th digit visualized.
         Technically difficult due to fetal position.
Cervix Uterus Adnexa

 Cervix
 Length:           5.22  cm.
 Normal appearance by transabdominal scan.

 Uterus
 No abnormality visualized.

 Left Ovary
 Within normal limits.

 Right Ovary
 Within normal limits.
 Adnexa
 No abnormality visualized.
Impression

 Ms. Stela is here for fetal anatomy scan. She has not had
 screening for fetal aneuploidies.
 We performed a fetal anatomy scan. Bilateral mild
 pyelectases (urinary tract dilation) are seen. The left pelvis
 measures 4 millimeters and the right 6 mm. No other markers
 of aneuploidies or fetal structural defects are seen. Fetal
 biometry is consistent with her previously-established dates.
 Amniotic fluid is normal and good fetal activity is seen.

 I explained the significance of urinary tract dilation (UTD) that
 in a majority of cases, it resolves in utero or after birth. In a
 small number of cases, it can progress to obstructive
 uropathy. It is also a marker for Down syndrome.
 I informed the patient that advanced maternal age is also
 associated with increased risk for fetal aneuploidies.

 I discussed the following options: 1) Maternal blood cell-free
 fetal DNA screening  for trisomies 21, 18 and 13, which has a
 greater detection rate than conventional screening tests. I
 informed the patient that not all chromosomal malformations
 are detected by this test. 2) I informed her that only
 amniocentesis will give a definitive result on the fetal
 karyotype. I discussed a procedure-related pregnancy loss of
 about 1 in 500.

 Patient understands the limitations of ultrasound in detecting
 fetal anomalies and opted to have cell-free fetal DNA
 screening. Blood was drawn today for cell-free fetal DNA
 screening (Maternit 21). We will communicate the results to
 the patient and scan a copy into the EMR.
Recommendations

 -An appointment was made for her to return in 8 weeks for
 fetal growth and renal assessments.
                 Rene, Nane

## 2020-10-28 IMAGING — US US MFM OB FOLLOW UP
1 series · 14 of 28 positions shown · non-contrast
Comparison: none

[Series 1: us mfm ob follow up · 31 acquisitions, 14 frames shown]
[im 2/31]
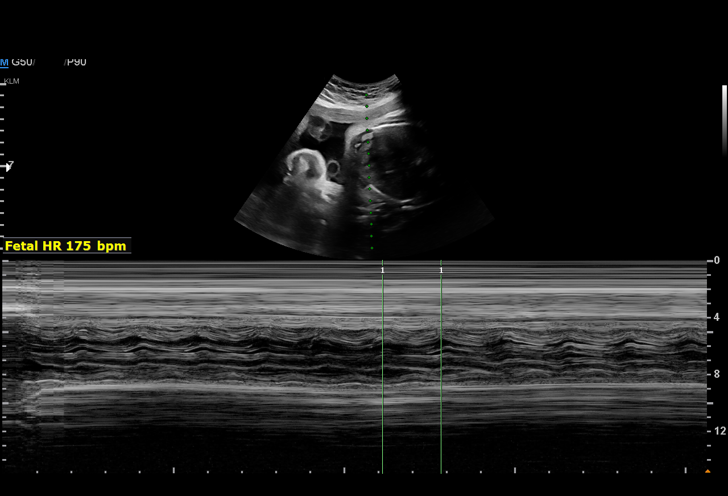
[im 4/31]
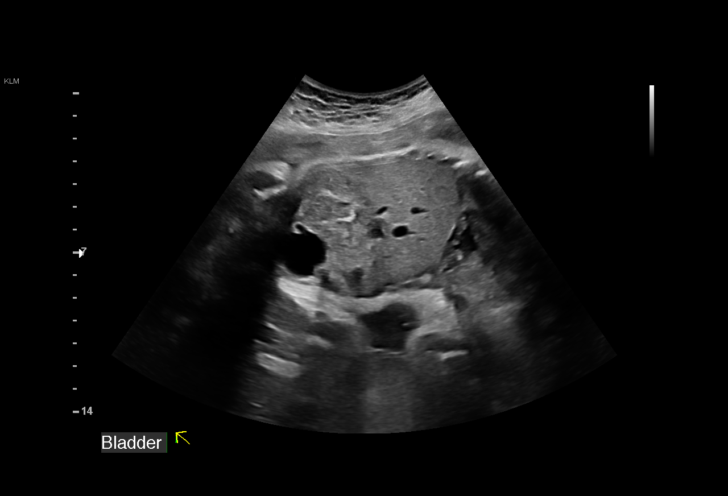
[im 6/31]
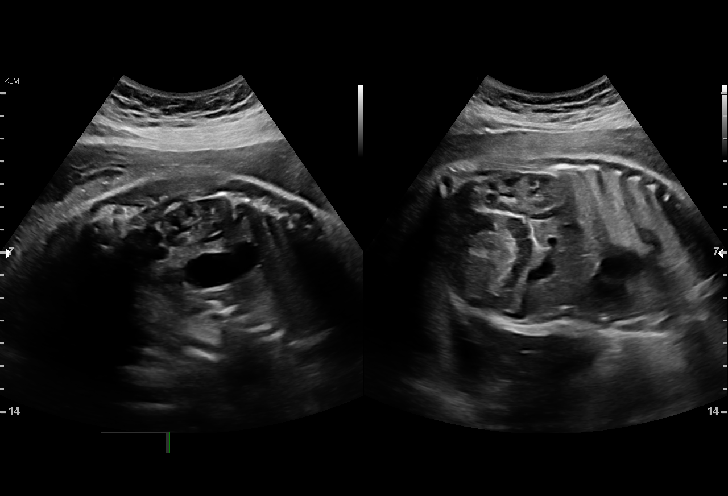
[im 8/31]
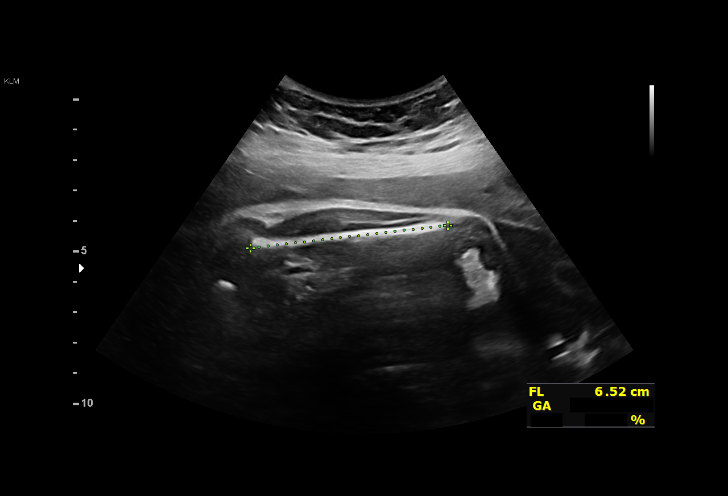
[im 11/31]
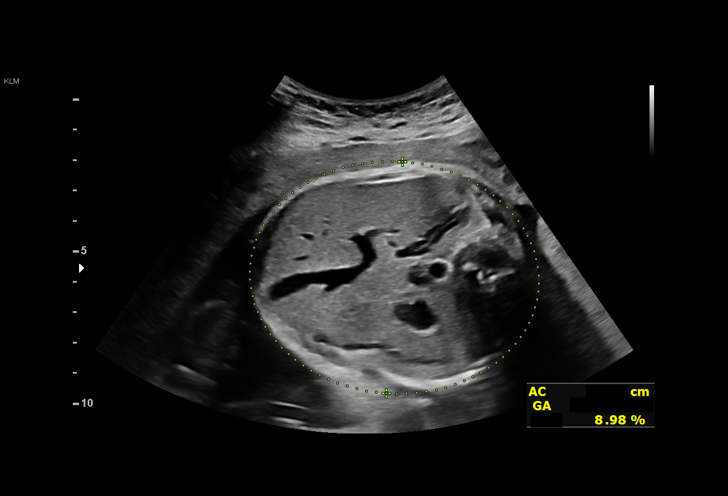
[im 13/31]
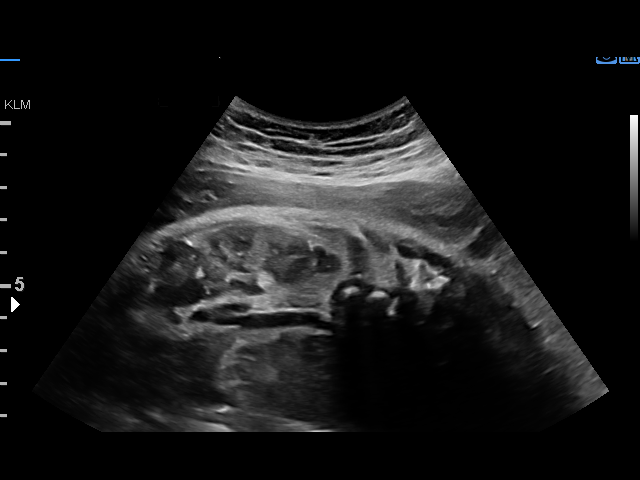
[im 15/31]
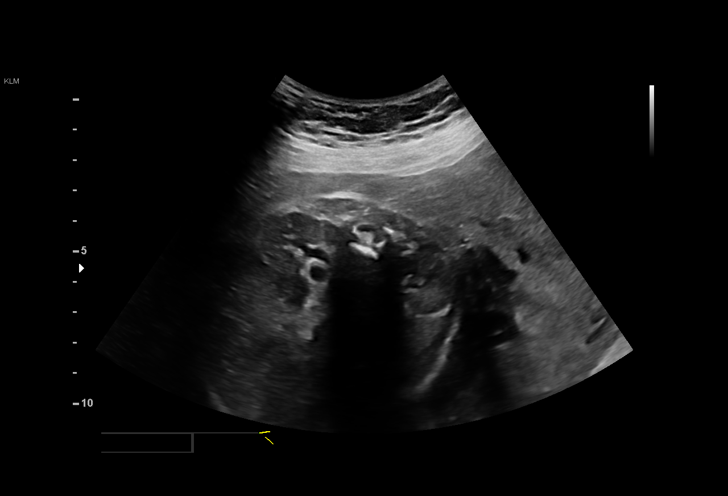
[im 17/31]
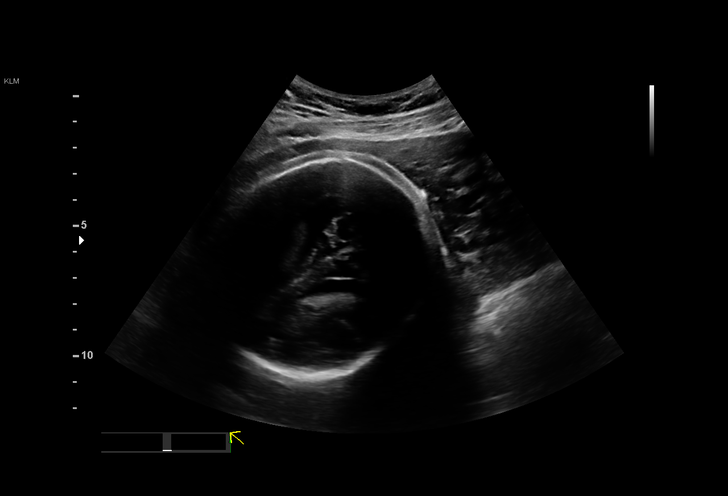
[im 19/31]
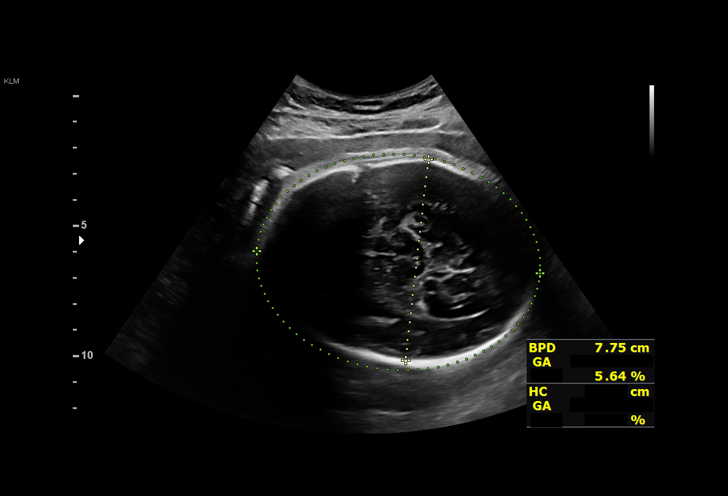
[im 22/31]
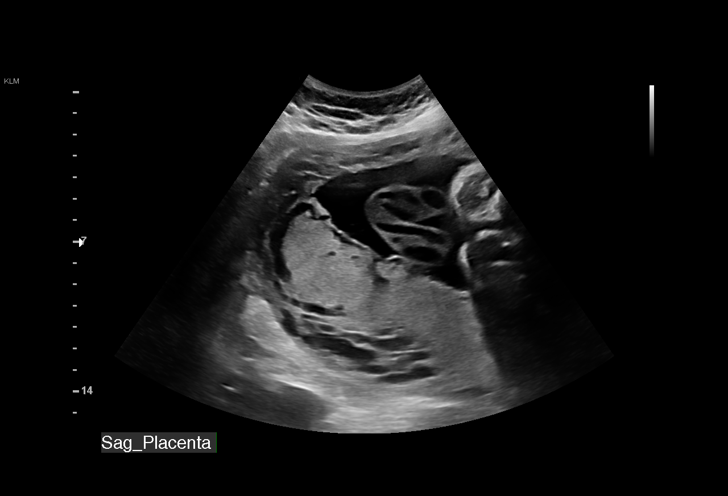
[im 24/31]
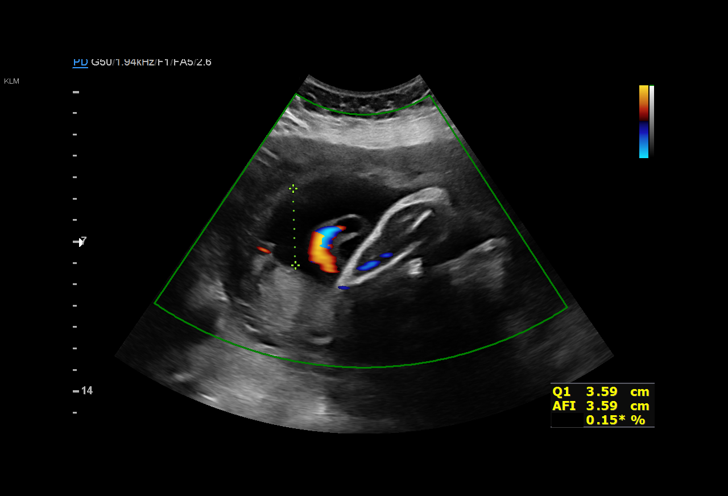
[im 26/31]
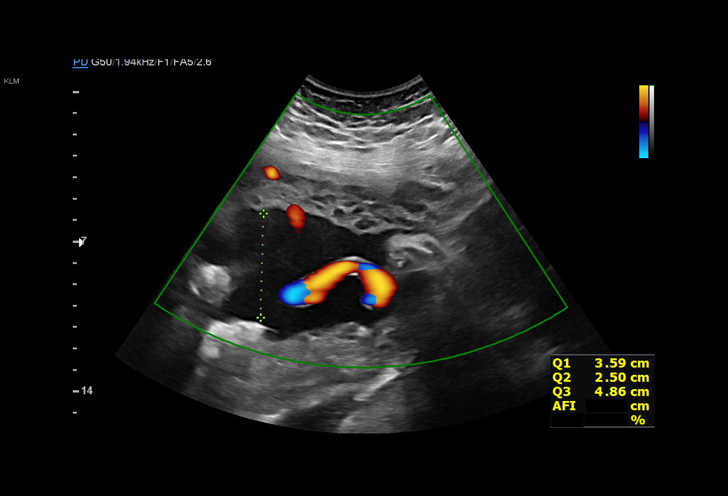
[im 28/31]
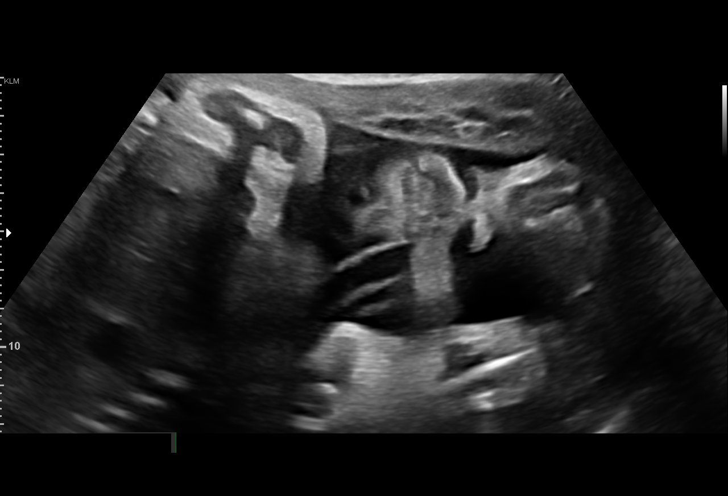
[im 31/31]
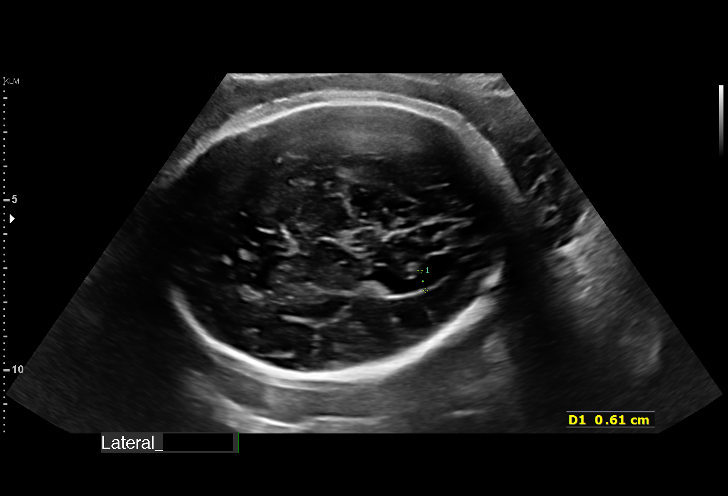

[14 of 28 positions shown; findings below may reference images not displayed]

[REDACTED]
                   RD CNM

 ----------------------------------------------------------------------

 ----------------------------------------------------------------------
Indications

  Advanced maternal age multigravida 35+,
  third trimester
  32 weeks gestation of pregnancy
  Rh negative state in antepartum
  Cerebral ventriculomegaly
 ----------------------------------------------------------------------
Vital Signs

                                                Height:        5'9"
Fetal Evaluation

 Num Of Fetuses:          1
 Fetal Heart Rate(bpm):   175
 Cardiac Activity:        Observed
 Presentation:            Cephalic
 Placenta:                Posterior
 P. Cord Insertion:       Previously Visualized

 Amniotic Fluid
 AFI FV:      Within normal limits

 AFI Sum(cm)     %Tile       Largest Pocket(cm)
 12.63           37

 RUQ(cm)       RLQ(cm)       LUQ(cm)        LLQ(cm)

Biometry
 BPD:      77.9  mm     G. Age:  31w 2d          7  %    CI:        66.43   %    70 - 86
                                                         FL/HC:       21.5  %    19.9 -
 HC:      306.4  mm     G. Age:  34w 1d         46  %    HC/AC:       1.12       0.96 -
 AC:      274.3  mm     G. Age:  31w 4d         15  %    FL/BPD:      84.5  %    71 - 87
 FL:       65.8  mm     G. Age:  33w 6d         67  %    FL/AC:       24.0  %    20 - 24

 Est. FW:    1740   gm     4 lb 6 oz     29  %
OB History

 Gravidity:    6         Term:   5
Gestational Age

 LMP:           32w 6d        Date:  02/15/18                 EDD:   11/22/18
 U/S Today:     32w 5d                                        EDD:   11/23/18
 Best:          32w 6d     Det. By:  LMP  (02/15/18)          EDD:   11/22/18
Anatomy

 Cranium:               Appears normal         LVOT:                   Previously seen
 Cavum:                 Appears normal         Aortic Arch:            Previously seen
 Ventricles:            Appears normal         Ductal Arch:            Previously seen
 Choroid Plexus:        Appears normal         Diaphragm:              Appears normal
 Cerebellum:            Appears normal         Stomach:                Appears normal, left
                                                                       sided
 Posterior Fossa:       Appears normal         Abdomen:                Appears normal
 Nuchal Fold:           Not applicable (>20    Abdominal Wall:         Previously seen
                        wks GA)
 Face:                  Orbits and profile     Cord Vessels:           Previously seen
                        previously seen
 Lips:                  Previously seen        Kidneys:                Appear normal
 Palate:                Previously seen        Bladder:                Appears normal
 Thoracic:              Appears normal         Spine:                  Previously seen
 Heart:                 Previously seen        Upper Extremities:      Previously seen
 RVOT:                  Previously seen        Lower Extremities:      Previously seen

 Other:  Fetus appears to be female. Nasal bone previously visualized. Heels
         and 5th digit previously seen.
Cervix Uterus Adnexa

 Cervix
 Not visualized (advanced GA >97wks)
Impression

 Normal interval growth.
 No evidence of ventriculomegaly.
Recommendations

 Follow up as clinically indicated.
